# Patient Record
Sex: Female | Born: 2000 | Race: White | Hispanic: No | Marital: Single
Health system: Southern US, Community
[De-identification: ages and names within clinical notes are randomized; demographics above are authoritative.]

---
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
## 2021-05-21 ENCOUNTER — Ambulatory Visit: Payer: BC Managed Care – PPO | Attending: Orthopedic Surgery | Admitting: Physical Therapy

## 2021-05-21 ENCOUNTER — Encounter: Payer: Self-pay | Admitting: Physical Therapy

## 2021-05-21 ENCOUNTER — Other Ambulatory Visit: Payer: Self-pay

## 2021-05-21 DIAGNOSIS — M6281 Muscle weakness (generalized): Secondary | ICD-10-CM | POA: Diagnosis present

## 2021-05-21 DIAGNOSIS — M25561 Pain in right knee: Secondary | ICD-10-CM | POA: Insufficient documentation

## 2021-05-21 DIAGNOSIS — G8929 Other chronic pain: Secondary | ICD-10-CM | POA: Insufficient documentation

## 2021-05-21 NOTE — Therapy (Signed)
[facility] @ [address] [place], [place], [address] Phone: [phone]   Fax:  [phone]  Physical Therapy Evaluation  Patient Details  Name: Jocelyn Avery MRN: [id number] Date of Birth: 10-Sep-2000 Referring Provider (PT): Dr. Leavy Cella   Encounter Date: 05/21/2021   PT End of Session - 05/21/21 1402     Visit Number 1    Date for PT Re-Evaluation 06/18/21    Authorization Type BCBS    PT Start Time 0930    PT Stop Time 1015    PT Time Calculation (min) 45 min    Activity Tolerance Patient tolerated treatment well             History reviewed. No pertinent past medical history.  History reviewed. No pertinent surgical history.  There were no vitals filed for this visit.    Subjective Assessment - 05/21/21 0933     Subjective From [facility] but goes to college in [place].  Runs cross country for college.  Distal ITB pain started in 2021 while running.  Conservative measures didn't help.  Surgery 03/04/21.  Crutches for 6 weeks;  PT in [place] 2x/week: calf raises, step ups, step downs, squats with band, plank clams,  squat walk with band.  Goes to gym at school.  Doing leg press.  Surgeon says return to running end of January but OK to return to skiing.  Did aquatic PT on treadmill.    Pertinent History returns to college Jan 12;  surgery 03/04/2021    Limitations House hold activities    How long can you walk comfortably? as long as I want    Patient Stated Goals get back to competitive running in August    Currently in Pain? Yes    Pain Location Knee    Pain Orientation Left    Aggravating Factors  nothing                [facility] PT Assessment - 05/21/21 0001       Assessment   Medical Diagnosis left knee  ITB Z surgery    Referring Provider (PT) Dr. Leavy Cella    Onset Date/Surgical Date 03/04/21    Next MD Visit beginning of Feb    Prior Therapy in [place]      Precautions   Precaution  Comments no running until end of Jan      Restrictions   Weight Bearing Restrictions No      Balance Screen   Has the patient fallen in the past 6 months No    Has the patient had a decrease in activity level because of a fear of falling?  No    Is the patient reluctant to leave their home because of a fear of falling?  No      Home Ecologist residence    Living Arrangements Other (Comment)    Additional Comments college student home for winter break      Observation/Other Assessments   Focus on Therapeutic Outcomes (FOTO)  69%      Posture/Postural Control   Posture/Postural Control No significant limitations    Posture Comments no edema      AROM   Right Knee Extension 0    Right Knee Flexion 145    Left Knee Extension 0    Left Knee Flexion 145      Strength   Overall Strength Comments single leg balance 30 sec without compensatory strategies  Right Hip Extension 5/5    Right Hip ABduction 5/5    Right Hip ADduction 5/5    Left Hip Extension 5/5    Left Hip ABduction 4+/5    Left Hip ADduction 5/5    Right Knee Flexion 5/5    Right Knee Extension 5/5    Left Knee Flexion 4+/5    Left Knee Extension 4/5      Flexibility   Hamstrings 80 degrees bil      Palpation   Palpation comment Good scar mobility                        Objective measurements completed on examination: See above findings.       [facility] Adult PT Treatment/Exercise - 05/21/21 0001       Knee/Hip Exercises: Aerobic   Stationary Bike L3 5 min while waiting to schedule                       PT Short Term Goals - 05/21/21 1422       PT SHORT TERM GOAL #1   Title STG=LTG               PT Long Term Goals - 05/21/21 1422       PT LONG TERM GOAL #1   Title The patient will be able to continue with a progression of strengthening and proprioceptive ex's to prepare for return to running    Time 4    Period Weeks     Status New    Target Date 06/18/21      PT LONG TERM GOAL #2   Title The patient will have quad and glute strength to 5-/5 needed to perform a single leg squat and to descend a 6 inch step    Time 4    Period Weeks    Status New      PT LONG TERM GOAL #3   Title FOTO score improved to 68%    Time 4    Period Weeks    Status New                    Plan - 05/21/21 1404     Clinical Impression Statement The 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   She had about a year of left distal and lateral thigh/knee pain which did not improve with conservative treatment.  She underwent left iliotibial band Z lengthening on 03/04/2021 and used crutches for 6 weeks.  She  is now 11 weeks post op. She reports very little pain.  She has been receiving PT in [place] and  just returned home to [place] for Winter break and would like to continue her rehab.  She has no restrictions except no running until the end of January.  She was given the OK for skiing but she was unable to try before leaving [place].  She has a mobile scar on distal lateral thigh.  Minimal quad atrophy and no swelling.  Knee ROM is full and painfree.  Quad weakness noted with 4 inch step downs, grossly 4/5.  Mild weakness left glute medius 4+/5.  She would benefit from continuation of PT to prepare for return to running in late January and future return to college level cross country season in August.    Examination-Activity Limitations Squat;Locomotion Level;Stairs    Examination-Participation  Restrictions Other    Stability/Clinical Decision Making Stable/Uncomplicated    Clinical Decision Making Low    Rehab Potential Good    PT Frequency 2x / week    PT Duration 4 weeks    PT Treatment/Interventions ADLs/Self Care Home Management;Aquatic Therapy;Patient/family education;Manual techniques;Taping;Therapeutic exercise;Neuromuscular re-education;Therapeutic activities     PT Next Visit Plan aquatic (high level) to prepare for return to running end of Jan;  land based ex; Elliptical; banded monster walks, leg press; squats; step ups and downs    Consulted and Agree with Plan of Care Patient             Patient will benefit from skilled therapeutic intervention in order to improve the following deficits and impairments:  Decreased strength, Pain  Visit Diagnosis: Chronic pain of right knee - Plan: PT plan of care cert/re-cert  Muscle weakness (generalized) - Plan: PT plan of care cert/re-cert     Problem List There are no problems to display for this patient.  Ruben Im, PT 05/21/21 2:27 PM Phone: [phone] Fax: [phone]  Alvera Singh, PT 05/21/2021, 2:26 PM  [facility] @ [place] [place], [place], [address] Phone: [phone]   Fax:  [phone]  Name: Jocelyn Avery MRN: [id number] Date of Birth: 12-08-2000

## 2021-05-24 ENCOUNTER — Ambulatory Visit: Payer: BC Managed Care – PPO | Admitting: Physical Therapy

## 2021-05-24 ENCOUNTER — Encounter: Payer: Self-pay | Admitting: Physical Therapy

## 2021-05-24 ENCOUNTER — Other Ambulatory Visit: Payer: Self-pay

## 2021-05-24 DIAGNOSIS — M25561 Pain in right knee: Secondary | ICD-10-CM

## 2021-05-24 DIAGNOSIS — M6281 Muscle weakness (generalized): Secondary | ICD-10-CM

## 2021-05-24 NOTE — Patient Instructions (Signed)
° ° ° °  [facility] Welcome to [facility]! Here you will find all the information you will need regarding your pool therapy. If you have further questions at any time, please call our office at [phone]. After completing your initial evaluation in the [facility], you may be eligible to complete a portion of your therapy in the pool. A typical week of therapy will consist of 1-2 typical physical therapy visits at our [facility] location and an additional session of therapy in the pool located at the [facility] at [address]. [address], [place] [address]. The phone number at the pool site is [phone]. Please call this number if you are running late or need to cancel your appointment.  Aquatic therapy will be offered on Wednesday mornings and Friday afternoons. Each session will last approximately 45 minutes. All scheduling and payments for aquatic therapy sessions, including cancelations, will be done through our [facility] location.  To be eligible for aquatic therapy, these criteria must be met: You must be able to independently change in the locker room and get to the pool deck. A caregiver can come with you to help if needed. There are benches for a caregiver to sit on next to the pool. No one with an open wound is permitted in the pool.  Handicap parking is available in the front and there is a drop off option for even closer accessibility. Please arrive 15 minutes prior to your appointment to prepare for your pool session. You must sign in at the front desk upon your arrival. Please be sure to attend to any toileting needs prior to entering the pool. Locker rooms for changing are available.  There is direct access to the pool deck from the locker room. You can lock your belongings in a locker or bring them with you poolside. Your therapist will greet you on the pool deck. There may be other swimmers in the pool at the  same time but your session is one-on-one with the therapist.   

## 2021-05-24 NOTE — Therapy (Signed)
[facility] @ [address] [place], [place], [address] Phone: [phone]   Fax:  [phone]  Physical Therapy Treatment  Patient Details  Name: Jocelyn Avery MRN: [id number] Date of Birth: Sep 01, 2000 Referring Provider (PT): Dr. Leavy Cella   Encounter Date: 05/24/2021   PT End of Session - 05/24/21 1012     Visit Number 2    Date for PT Re-Evaluation 06/18/21    Authorization Type BCBS    PT Start Time 0930    PT Stop Time 1010    PT Time Calculation (min) 40 min    Activity Tolerance Patient tolerated treatment well             History reviewed. No pertinent past medical history.  History reviewed. No pertinent surgical history.  There were no vitals filed for this visit.   Subjective Assessment - 05/24/21 0936     Subjective I felt good after the initial evaluation. She brought in the past progress reports from last PT visit.    Pertinent History returns to college Jan 12;  surgery 03/04/2021    Limitations House hold activities    How long can you walk comfortably? as long as I want    Patient Stated Goals get back to competitive running in August    Currently in Pain? No/denies                               [facility] Adult PT Treatment/Exercise - 05/24/21 0001       Knee/Hip Exercises: Aerobic   Stationary Bike L3 5 min while PT was assessing patient      Knee/Hip Exercises: Machines for Strengthening   Cybex Leg Press 2 legs at 70# 30x; left leg 30# 30x   seat #5     Knee/Hip Exercises: Standing   Heel Raises Both;2 sets;15 reps;1 second   holding 5# kettle bell, on stairs and no hands to hold on   Heel Raises Limitations left heel raises on step 30x moving through thr range    Step Down Left;Hand Hold: 2;2 sets;15 reps   right toe taps the ground backward,   Step Down Limitations VC to not let knee go inward; then did a forward step down  with right foot going down and VC to not  let hip drop and knee go inward    Functional Squat 2 sets;15 reps    Functional Squat Limitations on flat side of BOSU ball and yellow band around knees                     PT Education - 05/24/21 1012     Education Details information on aquatic program    Person(s) Educated Patient    Methods Explanation;Handout    Comprehension Verbalized understanding              PT Short Term Goals - 05/21/21 1422       PT SHORT TERM GOAL #1   Title STG=LTG               PT Long Term Goals - 05/21/21 1422       PT LONG TERM GOAL #1   Title The patient will be able to continue with a progression of strengthening and proprioceptive ex's to prepare for return to running    Time 4    Period Weeks    Status New  Target Date 06/18/21      PT LONG TERM GOAL #2   Title The patient will have quad and glute strength to 5-/5 needed to perform a single leg squat and to descend a 6 inch step    Time 4    Period Weeks    Status New      PT LONG TERM GOAL #3   Title FOTO score improved to 68%    Time 4    Period Weeks    Status New                   Plan - 05/24/21 1013     Clinical Impression Statement Patient did well in therapy. She needed veral cues to not drop her pelvis and not to bring her knee inward with step downs. Patient left calf fatiqued with calf raises as she is going through the full range. Patient brought her progress note from the last physical therapy place. Patient will benefit from skilled therapy to work on left leg strength to return to college level cross country season in August.    Examination-Activity Limitations Squat;Locomotion Level;Stairs    Stability/Clinical Decision Making Stable/Uncomplicated    Rehab Potential Good    PT Frequency 2x / week    PT Duration 4 weeks    PT Treatment/Interventions ADLs/Self Care Home Management;Aquatic Therapy;Patient/family education;Manual techniques;Taping;Therapeutic  exercise;Neuromuscular re-education;Therapeutic activities    PT Next Visit Plan aquatic (high level) to prepare for return to running end of Jan;  land based ex; Elliptical; banded monster walks, leg press; squats; step ups and downs; see her progress note from last PT place    Consulted and Agree with Plan of Care Patient             Patient will benefit from skilled therapeutic intervention in order to improve the following deficits and impairments:  Decreased strength, Pain  Visit Diagnosis: Chronic pain of right knee  Muscle weakness (generalized)     Problem List There are no problems to display for this patient.   Earlie Counts, PT 05/24/21 10:16 AM  [facility] @ [address] [place], [place], [address] Phone: [phone]   Fax:  [phone]  Name: Jocelyn Avery MRN: [id number] Date of Birth: 12-29-2000

## 2021-05-25 ENCOUNTER — Ambulatory Visit: Payer: BC Managed Care – PPO | Admitting: Physical Therapy

## 2021-05-26 ENCOUNTER — Encounter: Payer: Self-pay | Admitting: Physical Therapy

## 2021-05-26 ENCOUNTER — Other Ambulatory Visit: Payer: Self-pay

## 2021-05-26 ENCOUNTER — Ambulatory Visit: Payer: BC Managed Care – PPO | Admitting: Physical Therapy

## 2021-05-26 DIAGNOSIS — G8929 Other chronic pain: Secondary | ICD-10-CM

## 2021-05-26 DIAGNOSIS — M25561 Pain in right knee: Secondary | ICD-10-CM | POA: Diagnosis not present

## 2021-05-26 DIAGNOSIS — M6281 Muscle weakness (generalized): Secondary | ICD-10-CM

## 2021-05-26 NOTE — Therapy (Signed)
[facility] @ [address], [place], [address] Phone: [phone]   Fax:  [phone]  Physical Therapy Treatment  Patient Details  Name: Jocelyn Avery MRN: [id number] Date of Birth: Feb 20, 2001 Referring Provider (PT): Dr. Leavy Cella   Encounter Date: 05/26/2021   PT End of Session - 05/26/21 1058     Visit Number 3    Date for PT Re-Evaluation 06/18/21    Authorization Type BCBS    PT Start Time 0845    PT Stop Time 0930    PT Time Calculation (min) 45 min    Activity Tolerance Patient tolerated treatment well    Behavior During Therapy [facility] for tasks assessed/performed             History reviewed. No pertinent past medical history.  History reviewed. No pertinent surgical history.  There were no vitals filed for this visit.   Subjective Assessment - 05/26/21 1057     Subjective I am doing very well. I have no pain today.    Pertinent History returns to college Jan 12;  surgery 03/04/2021    Currently in Pain? No/denies             Treatment; Patient seen for aquatic therapy today.  Treatment took place in water 2.5-4 feet deep depending upon activity.  Pt entered the pool via stairs, no modifications. Water temp 93 degrees F. Pt requires buoyancy of water for support and to offload joints with strengthening exercises.  Pt utilizes viscosity of the water required for strengthening.   Seated water bench with 75% submersion Pt performed seated LE AROM exercises 20x in all planes, brief discussion of current status.  75% submersion water walking with yellow noodle for UE push/pull 10x in each direction.  Underwater bicycle in horseback position 5 min intervals at speed agreeable to pt; 4 bouts with 1 min rest in between sets.  Single limb stance LT: 4x 10 sec;VC to contract buttock more Supine iron cross 10 sec 5x using triple buoy UE weights: VC to contract buttocks more and level out her pelvis.    Standing hip circles 10x Bil with primary focus on stance leg.                              PT Short Term Goals - 05/21/21 1422       PT SHORT TERM GOAL #1   Title STG=LTG               PT Long Term Goals - 05/21/21 1422       PT LONG TERM GOAL #1   Title The patient will be able to continue with a progression of strengthening and proprioceptive ex's to prepare for return to running    Time 4    Period Weeks    Status New    Target Date 06/18/21      PT LONG TERM GOAL #2   Title The patient will have quad and glute strength to 5-/5 needed to perform a single leg squat and to descend a 6 inch step    Time 4    Period Weeks    Status New      PT LONG TERM GOAL #3   Title FOTO score improved to 68%    Time 4    Period Weeks    Status New  Plan - 05/26/21 1059     Clinical Impression Statement Pt arrived for her aquatic PT session pain free. Pt has previous experience with aquatic PT. Pt was able to pereform underwater bicycle simulation using the thicker noodle in horseback position so she had to engage her core as well: 5 min intervals 4x. Pt had no pain throughout the entire session. Pt demonstrates weakness in her Lt hip/glute in single limb stance and drops her pelvis in supine iron cross exercise. With increased awarness of this she was able to engage her gluteals better.    Examination-Activity Limitations Squat;Locomotion Level;Stairs    Stability/Clinical Decision Making Stable/Uncomplicated    Rehab Potential Good    PT Frequency 2x / week    PT Duration 4 weeks    PT Treatment/Interventions ADLs/Self Care Home Management;Aquatic Therapy;Patient/family education;Manual techniques;Taping;Therapeutic exercise;Neuromuscular re-education;Therapeutic activities    PT Next Visit Plan aquatic (high level) to prepare for return to running end of Jan;  land based ex; Elliptical; banded monster walks, leg press;  squats; step ups and downs; see her progress note from last PT place    Consulted and Agree with Plan of Care Patient             Patient will benefit from skilled therapeutic intervention in order to improve the following deficits and impairments:  Decreased strength, Pain  Visit Diagnosis: Chronic pain of right knee  Muscle weakness (generalized)     Problem List There are no problems to display for this patient.   Charlsie Fleeger, PTA 05/26/2021, 11:02 AM  [facility] @ [address] [place], [place], [address] Phone: [phone]   Fax:  [phone]  Name: Tenee Wish MRN: [id number] Date of Birth: 14-Mar-2001

## 2021-05-27 ENCOUNTER — Ambulatory Visit: Payer: BC Managed Care – PPO | Admitting: Physical Therapy

## 2021-05-27 DIAGNOSIS — M25561 Pain in right knee: Secondary | ICD-10-CM | POA: Diagnosis not present

## 2021-05-27 DIAGNOSIS — M6281 Muscle weakness (generalized): Secondary | ICD-10-CM

## 2021-05-27 DIAGNOSIS — G8929 Other chronic pain: Secondary | ICD-10-CM

## 2021-05-27 NOTE — Patient Instructions (Signed)
Access Code: [email or web address] URL: [email or web address] Date: 05/27/2021 Prepared by: Ruben Im  Exercises Supine Hamstring Stretch with Strap - 1 x daily - 7 x weekly - 1 sets - 3 reps - 20 hold Supine Piriformis Stretch - 1 x daily - 7 x weekly - 1 sets - 3 reps - 20 hold Thomas Stretch on Table - 1 x daily - 7 x weekly - 1 sets - 3 reps - 20 hold

## 2021-05-27 NOTE — Therapy (Signed)
[facility] @ [place], [place], [address] Phone: [phone]   Fax:  [phone]  Physical Therapy Treatment  Patient Details  Name: Jocelyn Avery MRN: [id number] Date of Birth: May 06, 2001 Referring Provider (PT): Dr. Leavy Cella   Encounter Date: 05/27/2021   PT End of Session - 05/27/21 1720     Visit Number 4    Date for PT Re-Evaluation 06/18/21    Authorization Type BCBS    PT Start Time 0757    PT Stop Time 0845    PT Time Calculation (min) 48 min    Activity Tolerance Patient tolerated treatment well             No past medical history on file.  No past surgical history on file.  There were no vitals filed for this visit.   Subjective Assessment - 05/27/21 0807     Subjective No soreness after last time.  Liked the pool PT yesterday.    Pertinent History returns to college Jan 12;  surgery 03/04/2021    Currently in Pain? No/denies    Pain Orientation Left                               [facility] Adult PT Treatment/Exercise - 05/27/21 0001       Knee/Hip Exercises: Stretches   Active Hamstring Stretch Right;Left;2 reps    Active Hamstring Stretch Limitations with strap with bias    Hip Flexor Stretch Limitations off end of mat 20 sec    Piriformis Stretch Right;Left;2 reps;20 seconds    Piriformis Stretch Limitations supine      Knee/Hip Exercises: Aerobic   Elliptical incline 3 8 min      Knee/Hip Exercises: Standing   Step Down Right;Left;Step Height: 4";10 reps    Lunge Walking - Round Trips dynamic warm up: hip rotation, tip toes; high step; lateral walk; UE swings    SLS with Vectors 10# kettlebell RDLS 5Q00 each leg    Other Standing Knee Exercises 4 inch alternating forward and back 10x right/left    Other Standing Knee Exercises black loop extra heavy clams on wall with ball next to knee 25x      Knee/Hip Exercises: Supine   Other Supine Knee/Hip Exercises  on incline wedge, feet on 4 inch box with 30# bar hip lift 20x                     PT Education - 05/27/21 1719     Education Details stretching hip flexor, HS, piriformis    Person(s) Educated Patient    Methods Explanation;Demonstration;Handout    Comprehension Returned demonstration;Verbalized understanding              PT Short Term Goals - 05/21/21 1422       PT SHORT TERM GOAL #1   Title STG=LTG               PT Long Term Goals - 05/21/21 1422       PT LONG TERM GOAL #1   Title The patient will be able to continue with a progression of strengthening and proprioceptive ex's to prepare for return to running    Time 4    Period Weeks    Status New    Target Date 06/18/21      PT LONG TERM GOAL #2   Title The patient will have quad  and glute strength to 5-/5 needed to perform a single leg squat and to descend a 6 inch step    Time 4    Period Weeks    Status New      PT LONG TERM GOAL #3   Title FOTO score improved to 68%    Time 4    Period Weeks    Status New                   Plan - 05/27/21 1719     Clinical Impression Statement The patient is able to perform a progression of exercise with focus on static and dynamic LE stretching to prepare for return to running at the end of January.  Also focused on quad and glute medius strengthening with minimal cues initially for patellofemoral alignment (no adduction/valgus) with step downs.  Cueing for increased hip hinge with side stepping (without and with band) to activate glutes instead of ITB.  She is highly motivated to return to running when allowed by the surgeon and hopes to be ready for time trials with college coach in July.  No pain during session.    Examination-Activity Limitations Squat;Locomotion Level;Stairs    Stability/Clinical Decision Making Stable/Uncomplicated    Rehab Potential Good    PT Frequency 2x / week    PT Duration 4 weeks    PT Treatment/Interventions  ADLs/Self Care Home Management;Aquatic Therapy;Patient/family education;Manual techniques;Taping;Therapeutic exercise;Neuromuscular re-education;Therapeutic activities    PT Next Visit Plan aquatic (high level) to prepare for return to running end of Jan;single leg strengthening;  high level proprioceptive ex; Elliptical; banded monster walks, leg press; squats; step ups and downs    PT Home Exercise Plan [email or web address]             Patient will benefit from skilled therapeutic intervention in order to improve the following deficits and impairments:  Decreased strength, Pain  Visit Diagnosis: Chronic pain of right knee  Muscle weakness (generalized)     Problem List There are no problems to display for this patient.  Ruben Im, PT 05/27/21 5:55 PM Phone: [phone] Fax: [phone]  Alvera Singh, PT 05/27/2021, 5:54 PM  [facility] @ [place], [place], [address] Phone: [phone]   Fax:  [phone]  Name: Hyun Reali MRN: [id number] Date of Birth: 11-23-2000

## 2021-06-01 ENCOUNTER — Ambulatory Visit: Payer: BC Managed Care – PPO

## 2021-06-01 ENCOUNTER — Ambulatory Visit: Payer: BC Managed Care – PPO | Admitting: Physical Therapy

## 2021-06-01 ENCOUNTER — Other Ambulatory Visit: Payer: Self-pay

## 2021-06-01 ENCOUNTER — Encounter: Payer: Self-pay | Admitting: Physical Therapy

## 2021-06-01 DIAGNOSIS — M6281 Muscle weakness (generalized): Secondary | ICD-10-CM

## 2021-06-01 DIAGNOSIS — G8929 Other chronic pain: Secondary | ICD-10-CM

## 2021-06-01 DIAGNOSIS — M25561 Pain in right knee: Secondary | ICD-10-CM | POA: Diagnosis not present

## 2021-06-01 NOTE — Therapy (Signed)
[facility] @ [place], [place], [address] Phone: [phone]   Fax:  [phone]  Physical Therapy Treatment  Patient Details  Name: Jocelyn Avery MRN: [id number] Date of Birth: April 13, 2001 Referring Provider (PT): Dr. Leavy Cella   Encounter Date: 06/01/2021   PT End of Session - 06/01/21 1151     Visit Number 5    Date for PT Re-Evaluation 06/18/21    Authorization Type BCBS    PT Start Time 1102    PT Stop Time 1145    PT Time Calculation (min) 43 min    Activity Tolerance Patient tolerated treatment well             No past medical history on file.  No past surgical history on file.  There were no vitals filed for this visit.   Subjective Assessment - 06/01/21 1107     Subjective My knee feels good!    Pertinent History returns to college Jan 12;  surgery 03/04/2021    Currently in Pain? No/denies    Pain Location Knee    Pain Orientation Left                               [facility] Adult PT Treatment/Exercise - 06/01/21 0001       Knee/Hip Exercises: Aerobic   Elliptical incline 5 8 min      Knee/Hip Exercises: Machines for Strengthening   Cybex Leg Press 85# bil 20x; 40# single leg 15x each leg    Hip Cybex 40# hip abduction 20x each side      Knee/Hip Exercises: Standing   Lunge Walking - Round Trips dynamic warm up with hurdles    SLS black band under ball of foot RDLS 9S85 each leg    Other Standing Knee Exercises next to wall backward tap then hip flexion and up on toes 15x each side    Other Standing Knee Exercises 30# bar dead lifts 15x      Knee/Hip Exercises: Sidelying   Other Sidelying Knee/Hip Exercises starfish planks with hip abduction 8x each side                       PT Short Term Goals - 05/21/21 1422       PT SHORT TERM GOAL #1   Title STG=LTG               PT Long Term Goals - 05/21/21 1422       PT LONG TERM GOAL #1    Title The patient will be able to continue with a progression of strengthening and proprioceptive ex's to prepare for return to running    Time 4    Period Weeks    Status New    Target Date 06/18/21      PT LONG TERM GOAL #2   Title The patient will have quad and glute strength to 5-/5 needed to perform a single leg squat and to descend a 6 inch step    Time 4    Period Weeks    Status New      PT LONG TERM GOAL #3   Title FOTO score improved to 68%    Time 4    Period Weeks    Status New                   Plan -  06/01/21 1152     Clinical Impression Statement The patient is progressing well with post-surgical rehab for ITB release on left 9/29.  She is on target with performance of higher level strengthening to prepare for return to running at the end of January (she is a college level cross country runner).  No cues needed today for patellofemoral alignment.  Some visible quad muscle quivering bilaterally.  She is highly motivated and compliant with her HEP.  She reports it "feels great" and likes the sensation of working out to the point of muscle fatigue again.    Examination-Activity Limitations Squat;Locomotion Level;Stairs    Rehab Potential Good    PT Frequency 2x / week    PT Duration 4 weeks    PT Treatment/Interventions ADLs/Self Care Home Management;Aquatic Therapy;Patient/family education;Manual techniques;Taping;Therapeutic exercise;Neuromuscular re-education;Therapeutic activities    PT Next Visit Plan aquatic (high level) to prepare for return to running end of Jan;single leg strengthening;  high level proprioceptive ex; Elliptical; banded monster walks, leg press; squats; step ups and downs    PT Home Exercise Plan [email or web address]    Consulted and Agree with Plan of Care Patient             Patient will benefit from skilled therapeutic intervention in order to improve the following deficits and impairments:  Decreased strength, Pain  Visit  Diagnosis: Chronic pain of right knee  Muscle weakness (generalized)     Problem List There are no problems to display for this patient.  Ruben Im, PT 06/01/21 12:00 PM Phone: [phone] Fax: [phone]   Alvera Singh, PT 06/01/2021, 11:59 AM  [facility] @ [facility] [place], [place], [address] Phone: [phone]   Fax:  [phone]  Name: Jocelyn Avery MRN: [id number] Date of Birth: 2000/11/29

## 2021-06-03 ENCOUNTER — Other Ambulatory Visit: Payer: Self-pay

## 2021-06-03 ENCOUNTER — Ambulatory Visit: Payer: BC Managed Care – PPO | Admitting: Physical Therapy

## 2021-06-03 ENCOUNTER — Encounter: Payer: Self-pay | Admitting: Physical Therapy

## 2021-06-03 DIAGNOSIS — G8929 Other chronic pain: Secondary | ICD-10-CM

## 2021-06-03 DIAGNOSIS — M6281 Muscle weakness (generalized): Secondary | ICD-10-CM

## 2021-06-03 DIAGNOSIS — M25561 Pain in right knee: Secondary | ICD-10-CM | POA: Diagnosis not present

## 2021-06-03 NOTE — Therapy (Signed)
[facility] @ [place], [place], [address] Phone: [phone]   Fax:  [phone]  Physical Therapy Treatment  Patient Details  Name: Jocelyn Avery MRN: [id number] Date of Birth: [birth year] Referring Provider (PT): Dr. Leavy Cella   Encounter Date: 06/03/2021   PT End of Session - 06/03/21 1147     Visit Number 6    Date for PT Re-Evaluation 06/18/21    Authorization Type BCBS    PT Start Time 6283    PT Stop Time 1228    PT Time Calculation (min) 43 min    Activity Tolerance Patient tolerated treatment well    Behavior During Therapy [facility] for tasks assessed/performed             History reviewed. No pertinent past medical history.  History reviewed. No pertinent surgical history.  There were no vitals filed for this visit.   Subjective Assessment - 06/03/21 1144     Subjective My knee is doing great.    Pertinent History returns to college Jan 12;  surgery 03/04/2021    Limitations House hold activities    How long can you walk comfortably? as long as I want    Patient Stated Goals get back to competitive running in August    Currently in Pain? No/denies                               [facility] Adult PT Treatment/Exercise - 06/03/21 0001       Exercises   Exercises Knee/Hip      Knee/Hip Exercises: Stretches   Other Knee/Hip Stretches warm up kne hug walk, open the gate walk, fig 4 sit back walk, hurdle sidestepping      Knee/Hip Exercises: Aerobic   Elliptical resistance 5, incline 10 x 4', incline 5 x 4', 8 min      Knee/Hip Exercises: Machines for Strengthening   Cybex Leg Press 85# bil 20x; 40# single leg 15x each leg    Hip Cybex 45# hip abduction 20x each side      Knee/Hip Exercises: Standing   Lateral Step Up Step Height: 8";10 reps;Left;Right    Lateral Step Up Limitations hold orange plyoball    SLS walking single leg "T" dead lift with orange plyo ball x 10     Other Standing Knee Exercises knee drivers orange plyo ball x 10 Rt/Lt, then next to wall backward tap then hip flexion and up on toes 15x each side    Other Standing Knee Exercises 40# bar (added 5lb on each end today) dead lifts 15x      Knee/Hip Exercises: Sidelying   Other Sidelying Knee/Hip Exercises starfish planks with hip abduction 8x each side    Other Sidelying Knee/Hip Exercises primal plank with alt hip ext x 10 reps                       PT Short Term Goals - 05/21/21 1422       PT SHORT TERM GOAL #1   Title STG=LTG               PT Long Term Goals - 05/21/21 1422       PT LONG TERM GOAL #1   Title The patient will be able to continue with a progression of strengthening and proprioceptive ex's to prepare for return to running    Time 4  Period Weeks    Status New    Target Date 06/18/21      PT LONG TERM GOAL #2   Title The patient will have quad and glute strength to 5-/5 needed to perform a single leg squat and to descend a 6 inch step    Time 4    Period Weeks    Status New      PT LONG TERM GOAL #3   Title FOTO score improved to 68%    Time 4    Period Weeks    Status New                   Plan - 06/03/21 1221     Clinical Impression Statement Pt arrives with report of good tolerance of PT to date.  She has no pain and displays excellent LE mobility and closed chain control with bil and single LE challenges.  She is on target with performance of higher level strengthening to prepare for return to running at the end of January. She is highly motivated and compliant with her HEP.    Examination-Activity Limitations Squat;Locomotion Level;Stairs    Stability/Clinical Decision Making Stable/Uncomplicated    Rehab Potential Good    PT Frequency 2x / week    PT Duration 4 weeks    PT Treatment/Interventions ADLs/Self Care Home Management;Aquatic Therapy;Patient/family education;Manual techniques;Taping;Therapeutic  exercise;Neuromuscular re-education;Therapeutic activities    PT Next Visit Plan high level ther ex to prepare for return to running end of Jan;single leg strengthening;  high level proprioceptive ex; Elliptical; banded monster walks, leg press; squats; step ups and downs    PT Home Exercise Plan [email or web address]    Consulted and Agree with Plan of Care Patient             Patient will benefit from skilled therapeutic intervention in order to improve the following deficits and impairments:  Decreased strength, Pain  Visit Diagnosis: Chronic pain of right knee  Muscle weakness (generalized)     Problem List There are no problems to display for this patient.   Baruch Merl, PT 06/03/21 12:30 PM   [facility] @ [address] [place], [place], [address] Phone: [phone]   Fax:  [phone]  Name: Jocelyn Avery MRN: [id number] Date of Birth: [birth year]

## 2021-06-08 ENCOUNTER — Other Ambulatory Visit: Payer: Self-pay

## 2021-06-08 ENCOUNTER — Ambulatory Visit: Payer: BC Managed Care – PPO | Attending: Orthopedic Surgery | Admitting: Physical Therapy

## 2021-06-08 ENCOUNTER — Encounter: Payer: Self-pay | Admitting: Physical Therapy

## 2021-06-08 DIAGNOSIS — M6281 Muscle weakness (generalized): Secondary | ICD-10-CM | POA: Diagnosis present

## 2021-06-08 DIAGNOSIS — G8929 Other chronic pain: Secondary | ICD-10-CM | POA: Diagnosis present

## 2021-06-08 DIAGNOSIS — M25561 Pain in right knee: Secondary | ICD-10-CM | POA: Diagnosis present

## 2021-06-08 NOTE — Therapy (Signed)
[facility] @ [facility] [place], [place], [address] Phone: [phone]   Fax:  [phone]  Physical Therapy Treatment  Patient Details  Name: Jocelyn Avery MRN: [id number] Date of Birth: 01/16/2001 Referring Provider (PT): Dr. Leavy Cella   Encounter Date: 06/08/2021   PT End of Session - 06/08/21 0755     Visit Number 7    Date for PT Re-Evaluation 06/18/21    Authorization Type BCBS    PT Start Time 0755    PT Stop Time 0845    PT Time Calculation (min) 50 min    Activity Tolerance Patient tolerated treatment well    Behavior During Therapy [facility] for tasks assessed/performed             History reviewed. No pertinent past medical history.  History reviewed. No pertinent surgical history.  There were no vitals filed for this visit.   Subjective Assessment - 06/08/21 0754     Pertinent History returns to college Jan 12;  surgery 03/04/2021    Limitations House hold activities    How long can you walk comfortably? as long as I want    Patient Stated Goals get back to competitive running in August                               [facility] Adult PT Treatment/Exercise - 06/08/21 0001       Self-Care   Self-Care Other Self-Care Comments    Other Self-Care Comments  use of massage gun and foam roller for Rt ITB mobility      Exercises   Exercises Knee/Hip      Knee/Hip Exercises: Stretches   Other Knee/Hip Stretches warm up kne hug walk, open the gate walk, fig 4 sit back walk      Knee/Hip Exercises: Aerobic   Elliptical resistance 5, incline 10 x 4', incline 5 x 4', 8 min      Knee/Hip Exercises: Machines for Strengthening   Cybex Leg Press 85# bil 20x; 50# single leg 15x each leg    Hip Cybex 45# hip abduction 20x each side      Knee/Hip Exercises: Standing   Lateral Step Up Step Height: 8";10 reps;Left;Right    Lateral Step Up Limitations hold orange plyoball    Functional Squat 10  reps    Functional Squat Limitations 10lb dumbbells bil UEs, squat to march Rt/Lt alt    SLS walking single leg "T" dead lift with orange plyo ball x 10    Gait Training monster walks x 5 laps 8 steps fwd/bwd each    Other Standing Knee Exercises knee drivers orange plyo ball x 10 Rt/Lt, then next to wall backward tap then hip flexion and up on toes 15x each side    Other Standing Knee Exercises 40# bar (added 5lb on each end today) dead lifts 15x      Knee/Hip Exercises: Supine   Other Supine Knee/Hip Exercises bridge with hamstring curl feet on red ball in/out x 10      Knee/Hip Exercises: Sidelying   Other Sidelying Knee/Hip Exercises starfish planks with hip abduction 8x each side      Knee/Hip Exercises: Prone   Other Prone Exercises chair plank with slider mountain climbers x 20, then slow motion mountain climbers x 10 without sliders for increased scapular and core control focus    Other Prone Exercises plank on elbows and toes  with alt hip ext x 10                       PT Short Term Goals - 05/21/21 1422       PT SHORT TERM GOAL #1   Title STG=LTG               PT Long Term Goals - 05/21/21 1422       PT LONG TERM GOAL #1   Title The patient will be able to continue with a progression of strengthening and proprioceptive ex's to prepare for return to running    Time 4    Period Weeks    Status New    Target Date 06/18/21      PT LONG TERM GOAL #2   Title The patient will have quad and glute strength to 5-/5 needed to perform a single leg squat and to descend a 6 inch step    Time 4    Period Weeks    Status New      PT LONG TERM GOAL #3   Title FOTO score improved to 68%    Time 4    Period Weeks    Status New                   Plan - 06/08/21 0757     Clinical Impression Statement Pt has tolerated sessions to date without pain and with good demo of form and mechanics with all LE strength challenges.  She is on target to meet  goals and will see her surgeon in [place] end of Jan after returning to school.  She tolerates both open chain and closed chain weighted ther ex with good knee and hip control and alignment.  Excellent mobility/flexibility is present in bil lower extremity chain. Pt incrased single leg leg press to 50lb today (from 40lb).  Pt has some weakness in scapular and core stabilization noted today with chair plank mountain climbers. PT reviewed use of massage gun and foam roller for Rt ITB (non-surgical side) which has intermittently bothered her in the past and has been sore as she increases her ther ex.  Continue along POC and continue to monitor Rt ITB (non-surgical) tolerance of progression of ther ex.    PT Frequency 2x / week    PT Duration 4 weeks    PT Treatment/Interventions ADLs/Self Care Home Management;Aquatic Therapy;Patient/family education;Manual techniques;Taping;Therapeutic exercise;Neuromuscular re-education;Therapeutic activities    PT Next Visit Plan continue to monitor tolerance of ther ex for bil ITB (non-surgical and surgical) high level ther ex to prepare for return to running end of Jan;single leg strengthening;  high level proprioceptive ex; Elliptical; banded monster walks, leg press; squats; step ups and downs    PT Home Exercise Plan [email or web address]    Consulted and Agree with Plan of Care Patient             Patient will benefit from skilled therapeutic intervention in order to improve the following deficits and impairments:     Visit Diagnosis: Chronic pain of right knee  Muscle weakness (generalized)     Problem List There are no problems to display for this patient.   Baruch Merl, PT 06/08/21 8:52 AM   [facility] @ [place], [place], [address] Phone: [phone]   Fax:  [phone]  Name: Jocelyn Avery MRN: [id number] Date of Birth: [birth year]

## 2021-06-10 ENCOUNTER — Other Ambulatory Visit: Payer: Self-pay

## 2021-06-10 ENCOUNTER — Encounter: Payer: Self-pay | Admitting: Physical Therapy

## 2021-06-10 ENCOUNTER — Ambulatory Visit: Payer: BC Managed Care – PPO | Admitting: Physical Therapy

## 2021-06-10 DIAGNOSIS — M25561 Pain in right knee: Secondary | ICD-10-CM

## 2021-06-10 DIAGNOSIS — G8929 Other chronic pain: Secondary | ICD-10-CM

## 2021-06-10 DIAGNOSIS — M6281 Muscle weakness (generalized): Secondary | ICD-10-CM

## 2021-06-10 NOTE — Therapy (Signed)
[facility] @ [address] [place], [place], [address] Phone: [phone]   Fax:  [phone]  Physical Therapy Treatment  Patient Details  Name: Jocelyn Avery MRN: [id number] Date of Birth: 2000/10/10 Referring Provider (PT): Dr. Leavy Cella   Encounter Date: 06/10/2021   PT End of Session - 06/10/21 0927     Visit Number 8    Date for PT Re-Evaluation 06/18/21    Authorization Type BCBS    PT Start Time 0928    PT Stop Time 1013    PT Time Calculation (min) 45 min    Activity Tolerance Patient tolerated treatment well    Behavior During Therapy [facility] for tasks assessed/performed             History reviewed. No pertinent past medical history.  History reviewed. No pertinent surgical history.  There were no vitals filed for this visit.   Subjective Assessment - 06/10/21 0926     Subjective Lt surgical knee is doing great.  Rt ITB is a little sore.  I have had trouble with it in the past.  I am using massage gun and foam roller for it.    Pertinent History returns to college Jan 12;  surgery 03/04/2021    Limitations House hold activities    How long can you walk comfortably? as long as I want    Patient Stated Goals get back to competitive running in August    Currently in Pain? Yes    Pain Score 3     Pain Location Knee    Pain Orientation Right;Lateral   non-surgical ITB   Pain Descriptors / Indicators Sore    Pain Onset In the past 7 days                               [facility] Adult PT Treatment/Exercise - 06/10/21 0001       Exercises   Exercises Knee/Hip;Shoulder      Knee/Hip Exercises: Stretches   Piriformis Stretch Right;60 seconds    Piriformis Stretch Limitations edge of mat table      Knee/Hip Exercises: Aerobic   Elliptical incline 9 resistance 5 x 8' PT present to discuss symptoms and monitor      Knee/Hip Exercises: Standing   SLS knee drivers holding orange plyoball alt  Rt/Lt every 3 reps x 5 rounds, cue for core and UE chop focus    SLS with Vectors on blue oval balance disc star drill toe taps x 3 rounds each Rt/Lt    Gait Training warm up: high knee walk up on toes, fig 4 walk x 2 laps    Other Standing Knee Exercises single leg standing T/RDL 1x10 Rt/Lt 10lb kbell      Knee/Hip Exercises: Supine   Bridges Limitations hip thrusters from static bridge head/shoulders on green physioball, 10lb weight plate on pelvis x 15      Shoulder Exercises: Supine   Other Supine Exercises chest press 7lb dumbbells bil UEs in static bridge on ball (head/shoulders on green physioball) x 15      Shoulder Exercises: Standing   Row Strengthening;Both;10 reps;Weights    Row Weight (lbs) 7    Row Limitations bent over row in quarter squat      Shoulder Exercises: ROM/Strengthening   Wall Pushups 10 reps    Wall Pushups Limitations hands on green physioball on wall    Plank Limitations  against wall forearms on green ball on wall, move ball up/down/Lt/Rt x 20      Manual Therapy   Manual Therapy Soft tissue mobilization    Soft tissue mobilization Addaday assisted STM Rt piriformis, glute med and ITB in Lt SL                       PT Short Term Goals - 05/21/21 1422       PT SHORT TERM GOAL #1   Title STG=LTG               PT Long Term Goals - 05/21/21 1422       PT LONG TERM GOAL #1   Title The patient will be able to continue with a progression of strengthening and proprioceptive ex's to prepare for return to running    Time 4    Period Weeks    Status New    Target Date 06/18/21      PT LONG TERM GOAL #2   Title The patient will have quad and glute strength to 5-/5 needed to perform a single leg squat and to descend a 6 inch step    Time 4    Period Weeks    Status New      PT LONG TERM GOAL #3   Title FOTO score improved to 68%    Time 4    Period Weeks    Status New                   Plan - 06/10/21 0934      Clinical Impression Statement Pt with excellent tolerance of PT to date for surgical side (Lt knee) but does note onset of some Rt ITB low-grade soreness for which she is using massage gun and foam roller.  Soreness is rated 3/10.  She has had trouble with Rt ITB in the past.  PT adjusted session today and closely monitored for tolerance of selected ther ex.  PT used fig 4 and pigeon pose edge of mat table for tight Rt piriformis along with Addaday assisted STM which gave Pt some relief of soreness and tension along Rt lateral hip and thigh.  PT included some upper body push/pull strength work given upper body weakness noted with prone plank drills last session.  Continue along POC.    Rehab Potential Good    PT Frequency 2x / week    PT Duration 4 weeks    PT Treatment/Interventions ADLs/Self Care Home Management;Aquatic Therapy;Patient/family education;Manual techniques;Taping;Therapeutic exercise;Neuromuscular re-education;Therapeutic activities    PT Next Visit Plan continue to monitor tolerance of ther ex for bil ITB (non-surgical and surgical) high level ther ex to prepare for return to running end of Jan;single leg strengthening;  high level proprioceptive ex; Elliptical; banded monster walks, leg press; squats; step ups and downs    PT Home Exercise Plan [id number]    Consulted and Agree with Plan of Care Patient             Patient will benefit from skilled therapeutic intervention in order to improve the following deficits and impairments:     Visit Diagnosis: Chronic pain of right knee  Muscle weakness (generalized)     Problem List There are no problems to display for this patient.   Baruch Merl, PT 06/10/21 10:15 AM   [facility] @ [address] [place], [place], [address] Phone: [phone]   Fax:  [phone]  Name: Jocelyn Avery MRN: [id number] Date of Birth: 12-01-2000

## 2021-06-14 ENCOUNTER — Encounter: Payer: Self-pay | Admitting: Physical Therapy

## 2021-06-14 ENCOUNTER — Ambulatory Visit: Payer: BC Managed Care – PPO | Admitting: Physical Therapy

## 2021-06-14 ENCOUNTER — Other Ambulatory Visit: Payer: Self-pay

## 2021-06-14 DIAGNOSIS — M6281 Muscle weakness (generalized): Secondary | ICD-10-CM

## 2021-06-14 DIAGNOSIS — M25561 Pain in right knee: Secondary | ICD-10-CM | POA: Diagnosis not present

## 2021-06-14 DIAGNOSIS — G8929 Other chronic pain: Secondary | ICD-10-CM

## 2021-06-14 NOTE — Therapy (Signed)
[facility] @ [address] [place], [place], [address] Phone: [phone]   Fax:  [phone]  Physical Therapy Treatment  Patient Details  Name: Jocelyn Avery MRN: [id number] Date of Birth: 2001/05/29 Referring Provider (PT): Dr. Leavy Cella   Encounter Date: 06/14/2021   PT End of Session - 06/14/21 0805     Visit Number 9    Date for PT Re-Evaluation 06/18/21    Authorization Type BCBS    PT Start Time 0803    PT Stop Time 0853    PT Time Calculation (min) 50 min    Activity Tolerance Patient tolerated treatment well    Behavior During Therapy [facility] for tasks assessed/performed             History reviewed. No pertinent past medical history.  History reviewed. No pertinent surgical history.  There were no vitals filed for this visit.   Subjective Assessment - 06/14/21 0804     Subjective No complaints this AM, the Rt IT band remains intermittently sore.    Pertinent History returns to college Jan 12;  surgery 03/04/2021    Currently in Pain? No/denies    Multiple Pain Sites No                               [facility] Adult PT Treatment/Exercise - 06/14/21 0001       Knee/Hip Exercises: Aerobic   Elliptical incline 9  L6 x 10 min with PTA present to discuss current status      Knee/Hip Exercises: Machines for Strengthening   Cybex Leg Press Seat 6: 90# 2x10 VC to concentrate on the deceleration phase: LTLE 50# 10x, 55# 10x      Knee/Hip Exercises: Standing   Lateral Step Up Left;2 sets;20 reps;Hand Hold: 0;Step Height: 8"   15# KB upright row for core strength   SLS knee drivers holding orange plyoball alt Rt/Lt 10x2 each side, cue for core and UE chop focus    SLS with Vectors Blue oval 3 way taps 10x LTLE   Sagittal weakness: used mirror to keep knee over second toe vs medial drop   Walking with Sports Cord 2 plates 10x reverse, then 10x side stepping RT 10x    Other Standing Knee Exercises  Lt single leg stance: 5# in UE hip hinges 2x10   Vc to keep pelvis as level as she can     Knee/Hip Exercises: Supine   Bridges Limitations hip thrusters from static bridge head/shoulders on green physioball, 10lb weight plate on pelvis 1L57                       PT Short Term Goals - 05/21/21 1422       PT SHORT TERM GOAL #1   Title STG=LTG               PT Long Term Goals - 05/21/21 1422       PT LONG TERM GOAL #1   Title The patient will be able to continue with a progression of strengthening and proprioceptive ex's to prepare for return to running    Time 4    Period Weeks    Status New    Target Date 06/18/21      PT LONG TERM GOAL #2   Title The patient will have quad and glute strength to 5-/5 needed to perform a single leg  squat and to descend a 6 inch step    Time 4    Period Weeks    Status New      PT LONG TERM GOAL #3   Title FOTO score improved to 68%    Time 4    Period Weeks    Status New                   Plan - 06/14/21 0805     Clinical Impression Statement Pt arrives pain free to land Pt today pain free. Pt reports she still experiences some Rt ITband soreness tha is intermittent in nature. Some sagittal weakness with singel leg stance exercises but pt showed improvement with practice,using the mirror helped. No pain with any exercise.    Examination-Activity Limitations Squat;Locomotion Level;Stairs    Examination-Participation Restrictions Other    Stability/Clinical Decision Making Stable/Uncomplicated    Rehab Potential Good    PT Frequency 2x / week    PT Duration 4 weeks    PT Treatment/Interventions ADLs/Self Care Home Management;Aquatic Therapy;Patient/family education;Manual techniques;Taping;Therapeutic exercise;Neuromuscular re-education;Therapeutic activities    PT Next Visit Plan continue to monitor tolerance of ther ex for bil ITB (non-surgical and surgical) high level ther ex to prepare for return to running  end of Jan;single leg strengthening;  high level proprioceptive ex; Elliptical; banded monster walks, leg press; squats; step ups and downs: aquatics next session    PT [place] and Agree with Plan of Care Patient             Patient will benefit from skilled therapeutic intervention in order to improve the following deficits and impairments:  Decreased strength, Pain  Visit Diagnosis: Chronic pain of right knee  Muscle weakness (generalized)     Problem List There are no problems to display for this patient.   Jocelyn Avery, PTA 06/14/2021, 8:54 AM  [facility] @ [place], [place], [address] Phone: [phone]   Fax:  [phone]  Name: Jocelyn Avery MRN: [id number] Date of Birth: [birth year]

## 2021-06-16 ENCOUNTER — Encounter: Payer: Self-pay | Admitting: Physical Therapy

## 2021-06-16 ENCOUNTER — Ambulatory Visit: Payer: BC Managed Care – PPO | Admitting: Physical Therapy

## 2021-06-16 ENCOUNTER — Other Ambulatory Visit: Payer: Self-pay

## 2021-06-16 DIAGNOSIS — G8929 Other chronic pain: Secondary | ICD-10-CM

## 2021-06-16 DIAGNOSIS — M25561 Pain in right knee: Secondary | ICD-10-CM | POA: Diagnosis not present

## 2021-06-16 DIAGNOSIS — M6281 Muscle weakness (generalized): Secondary | ICD-10-CM

## 2021-06-16 NOTE — Therapy (Addendum)
[facility] @ [address] [place], [place], [address] Phone: [phone]   Fax:  [phone]  Physical Therapy Treatment/Discharge Summary   Patient Details  Name: Jocelyn Avery MRN: [id number] Date of Birth: [birth year] Referring Provider (PT): Dr. Leavy Cella   Encounter Date: 06/16/2021   PT End of Session - 06/16/21 0930     Visit Number 10    Date for PT Re-Evaluation 06/18/21    Authorization Type BCBS    PT Start Time 0926    PT Stop Time 1020    PT Time Calculation (min) 54 min    Activity Tolerance Patient tolerated treatment well    Behavior During Therapy [facility] for tasks assessed/performed             History reviewed. No pertinent past medical history.  History reviewed. No pertinent surgical history.  There were no vitals filed for this visit.   Subjective Assessment - 06/16/21 0929     Subjective Felt good after last sesison. No pain.    Pertinent History returns to college Jan 12;  surgery 03/04/2021    Currently in Pain? No/denies                [facility] PT Assessment - 06/16/21 0001       Assessment   Medical Diagnosis left knee  ITB Z surgery    Referring Provider (PT) Dr. Leavy Cella    Onset Date/Surgical Date 03/04/21      Observation/Other Assessments   Focus on Therapeutic Outcomes (FOTO)  79%      Strength   Right Hip Extension 5/5    Right Hip ABduction 5/5    Right Hip ADduction 5/5    Left Hip Extension 5/5    Left Hip ABduction 5/5    Left Hip ADduction 5/5    Right Knee Flexion 5/5    Right Knee Extension 5/5    Left Knee Flexion 5/5    Left Knee Extension 5/5               Treatment: Patient seen for aquatic therapy today.  Treatment took place in water 3.5-4.8feet deep depending upon activity.  Pt entered the pool via stairs, mild to no use of railings, reciprocal pattern. Water temp 92 degrees F. Pt requires buoyancy of water for support and to offload joints  with strengthening exercises.  Pt utilizes viscosity of the water required for strengthening.  Seated water bench with 75% submersion Pt performed seated LE AROM exercises 20x in all planes, with concurrent discussion of current status, FOTO, and next steps upon return to school.   Standing 75% submersion: Water jogging with large noodle in horse stance x 10 min, good pace. Side stepping with hand paddles and mini squat 10x, Single leg stance with hand paddles moving arms in different directions to provide perturbations. Double blue UE weights pushing down as pt holds her V position, VC for deep core contraction to hold position, after many attempts/practice, then pt would transition into the Supine STAR and hold for 10 sec.                         PT Short Term Goals - 05/21/21 1422       PT SHORT TERM GOAL #1   Title STG=LTG               PT Long Term Goals - 06/16/21 1402  PT LONG TERM GOAL #1   Title The patient will be able to continue with a progression of strengthening and proprioceptive ex's to prepare for return to running    Time 4    Period Weeks    Status Achieved    Target Date 06/18/21      PT LONG TERM GOAL #2   Title The patient will have quad and glute strength to 5-/5 needed to perform a single leg squat and to descend a 6 inch step    Time 4    Period Weeks    Status Unable to assess      PT LONG TERM GOAL #3   Title FOTO score improved to 68%    Time 4    Period Weeks    Status Not Met   76%:Note that pt was not sure how to answer the running questions since she is not cleared by her MD to run yet. There may not be consistentency in answering those questions.                  Plan - 06/16/21 0930     Clinical Impression Statement Pt arrives for her last PT session ( aquatic)  today painfree. She returns back to her college tomorrow in [place]. She has experienced no pain with any PT, land or water. Her LE have tested  5/5 via MMT ( todays was in the water) meeting her LTG. FOTO score may not be accurate as she reports not knowing how to answer the questions regarding running. These answers may have hurt her overall score. Pt is prepared to continue PT upon return to [place]. She may see MD in 1 week.    Examination-Activity Limitations Squat;Locomotion Level;Stairs    Examination-Participation Restrictions Other    Stability/Clinical Decision Making Stable/Uncomplicated    Rehab Potential Good    PT Frequency 2x / week    PT Duration 4 weeks    PT Treatment/Interventions ADLs/Self Care Home Management;Aquatic Therapy;Patient/family education;Manual techniques;Taping;Therapeutic exercise;Neuromuscular re-education;Therapeutic activities    PT Next Visit Plan DC, pt returns to college tomorrow.    PT [place]             Patient will benefit from skilled therapeutic intervention in order to improve the following deficits and impairments:  Decreased strength, Pain  Visit Diagnosis: Chronic pain of right knee  Muscle weakness (generalized)   PHYSICAL THERAPY DISCHARGE SUMMARY  Visits from Start of Care: 10  Current functional level related to goals / functional outcomes: See clinical impressions above.  The patient is performing higher level therapeutic exercise within post surgical guidelines.  She would benefit from additional PT in [place] when her surgeon allows a return to running program to prepare for her return to sport.     Remaining deficits: As above   Education / Equipment: HEP    Patient agrees to discharge. Patient goals were met. Patient is being discharged due to meeting the stated rehab goals.   Problem List There are no problems to display for this patient. Ruben Im, PT 06/25/21 8:41 AM Phone: [phone] Fax: [phone]  Myrene Galas, PTA 06/16/21 2:18 PM  06/16/2021, 2:09 PM  [facility] @  [address] [place], [place], [address] Phone: [phone]   Fax:  [phone]  Name: Kerri Kovacik MRN: [id number] Date of Birth: May 07, 2001
